# Patient Record
Sex: Female | Born: 1950 | Race: White | Hispanic: No | Marital: Single
Health system: Midwestern US, Academic
[De-identification: ages and names within clinical notes are randomized; demographics above are authoritative.]

---

## 2018-01-26 ENCOUNTER — Encounter: Admit: 2018-01-26 | Discharge: 2018-01-26 | Payer: MEDICARE

## 2018-01-31 ENCOUNTER — Encounter: Admit: 2018-01-31 | Discharge: 2018-01-31 | Payer: MEDICARE

## 2018-02-27 ENCOUNTER — Encounter: Admit: 2018-02-27 | Discharge: 2018-02-27 | Payer: MEDICARE

## 2018-02-28 ENCOUNTER — Encounter: Admit: 2018-02-28 | Discharge: 2018-02-28 | Payer: MEDICARE

## 2018-02-28 DIAGNOSIS — I82409 Acute embolism and thrombosis of unspecified deep veins of unspecified lower extremity: Principal | ICD-10-CM

## 2018-02-28 DIAGNOSIS — R931 Abnormal findings on diagnostic imaging of heart and coronary circulation: ICD-10-CM

## 2018-03-06 ENCOUNTER — Encounter: Admit: 2018-03-06 | Discharge: 2018-03-06 | Payer: MEDICARE

## 2018-03-06 ENCOUNTER — Ambulatory Visit: Admit: 2018-03-06 | Discharge: 2018-03-06 | Payer: MEDICARE

## 2018-03-06 DIAGNOSIS — R931 Abnormal findings on diagnostic imaging of heart and coronary circulation: ICD-10-CM

## 2018-03-06 DIAGNOSIS — R0600 Dyspnea, unspecified: ICD-10-CM

## 2018-03-06 DIAGNOSIS — R1907 Generalized intra-abdominal and pelvic swelling, mass and lump: ICD-10-CM

## 2018-03-06 DIAGNOSIS — I82421 Acute embolism and thrombosis of right iliac vein: ICD-10-CM

## 2018-03-06 DIAGNOSIS — E669 Obesity, unspecified: ICD-10-CM

## 2018-03-06 DIAGNOSIS — I745 Embolism and thrombosis of iliac artery: ICD-10-CM

## 2018-03-06 DIAGNOSIS — I82409 Acute embolism and thrombosis of unspecified deep veins of unspecified lower extremity: Principal | ICD-10-CM

## 2018-03-20 ENCOUNTER — Ambulatory Visit: Admit: 2018-03-20 | Discharge: 2018-03-21 | Payer: MEDICARE

## 2018-03-20 ENCOUNTER — Encounter: Admit: 2018-03-20 | Discharge: 2018-03-20 | Payer: MEDICARE

## 2018-03-20 ENCOUNTER — Ambulatory Visit: Admit: 2018-03-20 | Discharge: 2018-03-20 | Payer: MEDICARE

## 2018-03-20 DIAGNOSIS — R931 Abnormal findings on diagnostic imaging of heart and coronary circulation: Principal | ICD-10-CM

## 2018-03-20 DIAGNOSIS — R0600 Dyspnea, unspecified: ICD-10-CM

## 2018-03-22 ENCOUNTER — Encounter: Admit: 2018-03-22 | Discharge: 2018-03-22 | Payer: MEDICARE

## 2018-03-22 DIAGNOSIS — R0600 Dyspnea, unspecified: Secondary | ICD-10-CM

## 2018-03-22 DIAGNOSIS — R931 Abnormal findings on diagnostic imaging of heart and coronary circulation: Secondary | ICD-10-CM

## 2018-03-23 ENCOUNTER — Encounter: Admit: 2018-03-23 | Discharge: 2018-03-23 | Payer: MEDICARE

## 2019-04-02 ENCOUNTER — Encounter: Admit: 2019-04-02 | Discharge: 2019-04-02 | Payer: MEDICARE

## 2019-04-02 DIAGNOSIS — R931 Abnormal findings on diagnostic imaging of heart and coronary circulation: Secondary | ICD-10-CM

## 2019-04-02 DIAGNOSIS — I82409 Acute embolism and thrombosis of unspecified deep veins of unspecified lower extremity: Secondary | ICD-10-CM

## 2019-04-02 DIAGNOSIS — R001 Bradycardia, unspecified: Secondary | ICD-10-CM

## 2019-04-02 DIAGNOSIS — E669 Obesity, unspecified: Secondary | ICD-10-CM

## 2019-04-02 DIAGNOSIS — I82421 Acute embolism and thrombosis of right iliac vein: Secondary | ICD-10-CM

## 2019-04-02 DIAGNOSIS — I745 Embolism and thrombosis of iliac artery: Secondary | ICD-10-CM

## 2019-04-02 NOTE — Progress Notes
Date of Service: 04/02/2019    Faustine Ferreri is a 69 y.o. female.       HPI     Patient is a very pleasant 69 year old white female, she was initially evaluated in December 2019.  Patient is here today for a follow-up office visit.  Today she is bradycardic, her heart rate is 52 bpm by EKG and 55 bpm by recorded vitals here in the office.  Her blood pressure is normal, it is 122/74 mmHg.    Patient does not report symptoms of dizziness, no lightheadedness, no presyncope or syncope.  She states she has had a good blood pressure and heart rate her entire life.  She does feel fatigued and probably not quite in shape to perform physical activity.    Other comorbidities include:    ? Elevated calcium score?patient was evaluated with a myocardial perfusion imaging study in December 2019, the tomographic pattern did not demonstrate ischemia.  She was also evaluated with an echocardiogram at that time and that was essentially unremarkable  ? 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 History of large abdominal mass, right lower extremity DVT, multiple pulmonary emboli and displaced ureters  ? 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  ? 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 DOE (dyspnea on exertion) 03/06/2018 ? Obesity (BMI 30.0-34.9) 03/06/2018   ? 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 Generalized abdominal mass 03/06/2018   ? Agatston coronary artery calcium score less than 100 03/06/2018   ? Deep venous thrombosis (HCC) 02/28/2018   ? Elevated coronary artery calcium score 02/28/2018     CT Heart Score [facility] [place] CT HEART SCORE INDICATION: Screening. FINDINGS: The calcium score is 17, which places the patient in the 60th percentile. There is no pericardial effusion. There is no aortic aneurysm. There are no pathologically enlarged lymph nodes. The lungs appear well aerated. Cysts within the hepatic dome. No acute fracture. IMPRESSION: Calcium score of 17, which places the patient in the 60th percentile. Likely mild or minimal coronary stenosis. Clinical History Current History HEART SCORE Previous History/Surgery NON-SMOKER CT Dose Statement All CT scans at this facility use dose modulation, and/or weight based dosing when appropriate to reduce radiation dose to as low as reasonably achievable. Calcium Score 0 : No identifiable atherosclerotic plaque. Very low cardiovascular disease risk. Less than 5% chance of coronary artery disease. A Negative Exam. Calcium Score 1-10 : Minimal plaque burden. Significant coronary artery disease very unlikely. Calcium Score 11-100 : Mild plaque burden. Likely mild or minimal coronary stenosis. Calcium Score 101-400 : Moderate plaque burden. Moderate non-obstructive coronary artery disease highly likely. Cardiology consultation  may be indicated based on risk factors. Calcium Score Over 400 : Extensive plaque burden.High likelihood of at lease one significant coronary stenosis (50% diameter). Cardiology consultation recommended. Over 1000 - encouraged.                Review of Systems   Constitution: Negative.   HENT: Negative.    Eyes: Negative.    Cardiovascular: Negative.    Respiratory: Negative.    Endocrine: Negative.    Hematologic/Lymphatic: Negative. Skin: Negative.    Musculoskeletal: Negative.    Gastrointestinal: Negative.    Genitourinary: Negative.    Neurological: Negative.    Psychiatric/Behavioral: Negative.    Allergic/Immunologic: Negative.        Physical Exam  General Appearance: normal in appearance  Skin: warm, moist, no ulcers or xanthomas  Eyes: conjunctivae and lids normal, pupils are equal and round  Lips & Oral Mucosa: no pallor or cyanosis  Neck Veins: neck veins are flat, neck veins are not distended  Chest Inspection: chest is normal in appearance  Respiratory Effort: breathing comfortably, no respiratory distress  Auscultation/Percussion: lungs clear to auscultation, no rales or rhonchi, no wheezing  Cardiac Rhythm: regular rhythm and normal rate  Cardiac Auscultation: S1, S2 normal, no rub, no gallop  Murmurs: no murmur  Carotid Arteries: normal carotid upstroke bilaterally, no bruit  Lower Extremity Edema: 1?2+ bilateral lower extremity edema  Abdominal Exam: soft, non-tender, no masses, bowel sounds normal  Liver & Spleen: no organomegaly  Language and Memory: patient responsive and seems to comprehend information  Neurologic Exam: neurological assessment grossly intact      Cardiovascular Studies  Twelve-lead EKG demonstrates sinus bradycardia, ventricular rate is 52 bpm, incomplete left axis deviation, borderline LVH    Problems Addressed Today  Encounter Diagnoses   Name Primary?   ? Acute deep vein thrombosis (DVT) of iliac vein of right lower extremity (HCC) Yes   ? Elevated coronary artery calcium score    ? Iliac artery occlusion, right (HCC)    ? Obesity (BMI 30.0-34.9)    ? Agatston coronary artery calcium score less than 100    ? Bradycardia        Assessment and Plan In summary: This is a 69 year old white female with asymptomatic sinus bradycardia, previous evaluation with an echocardiogram and perfusion imaging study in December 2019 did not reveal any significant abnormalities, patient does report fatigue and probably overall inability to perform sustained/more intense physical activity.  She has not been expressing any episodes of dizziness, no presyncope or syncope.      Plan:  1.  I did asked the patient to monitor for symptoms of dizziness, lightheadedness and presyncope  2.  Although we initially discussed to proceed with a 14-day Holter monitor and treadmill testing to evaluate for underlying chronotropic incompetence, after further discussing with the patient today it was decided to withhold on this test, continue monitor symptoms for the time being and keep a diary  3.  Also advised on a low-salt diet  4.  Follow-up office visit in 6 months         Current Medications (including today's revisions)  ? MULTIVITAMIN PO Take  by mouth.   ? TURMERIC PO Take  by mouth.

## 2019-06-19 ENCOUNTER — Encounter: Admit: 2019-06-19 | Discharge: 2019-06-19 | Payer: MEDICARE

## 2019-06-19 NOTE — Telephone Encounter
Pt called and states that she knows that she needs to exercise and she is considering purchasing a New-Step exercise machine - she states, "Like they have at [facility]".  She was wondering if Dr. Avie Arenas would be willing to right a prescription for an exercise machine for income tax purposes.  I told her I would discuss with Dr. Avie Arenas and get back with her.  She is agreeable and will wait for our return call.

## 2019-08-22 IMAGING — US ECHOCOMPL
2 series · 14 of 24 positions shown · non-contrast
Comparison: none

[Series 1: us echo 2d, wo/w m-mode, compl · 81 acquisitions, 13 frames shown (1 of 2)]
[im 1/81]
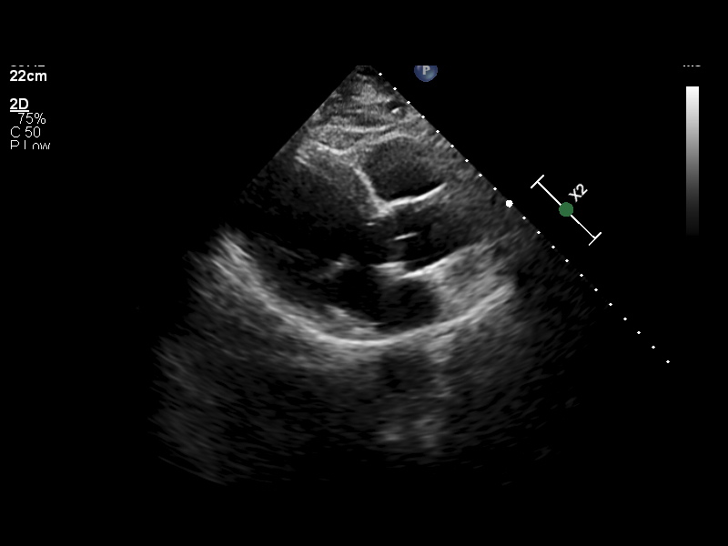
[im 8/81]
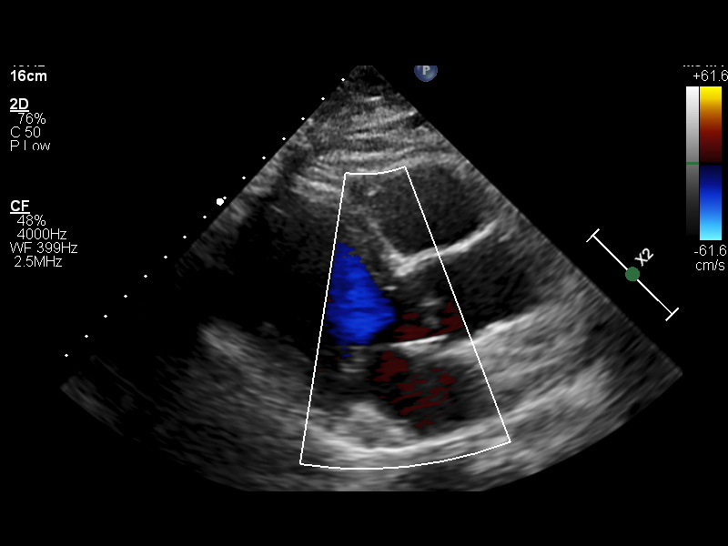
[im 15/81]
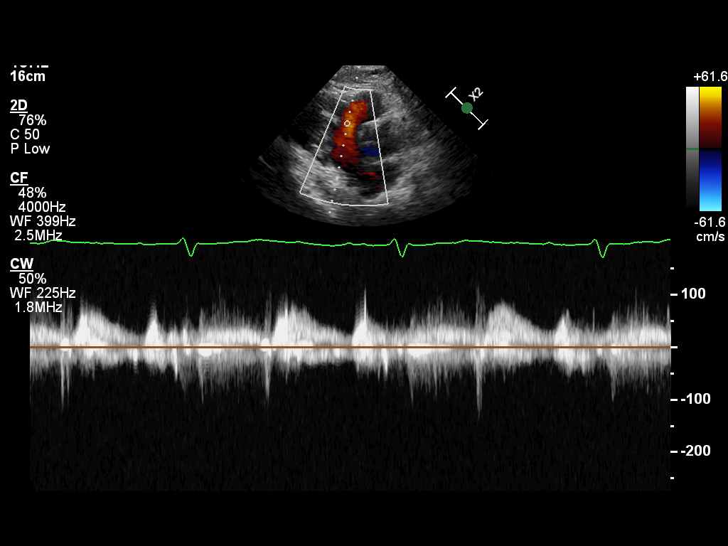
[im 22/81]
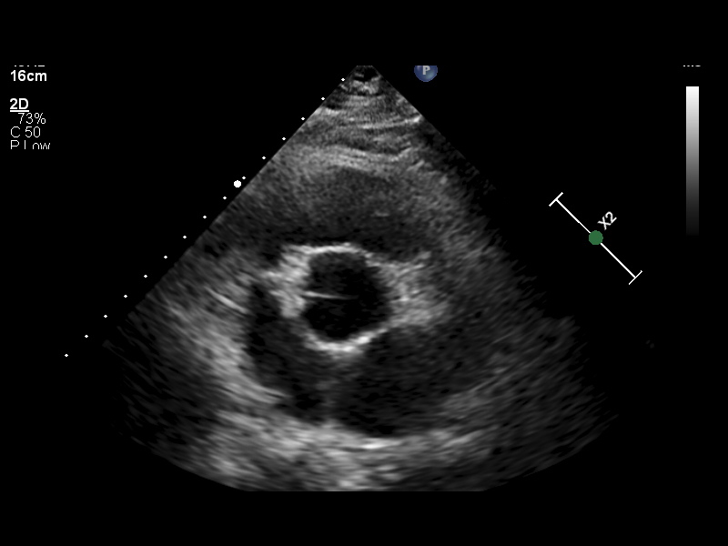
[im 26/81]
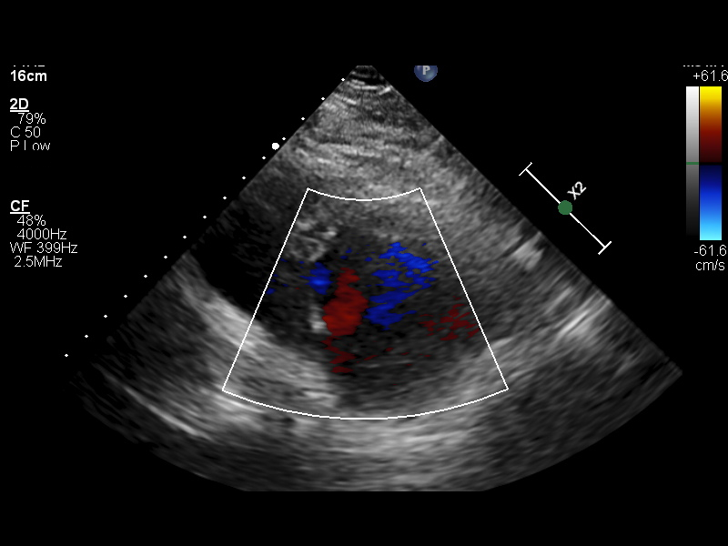
[im 33/81]
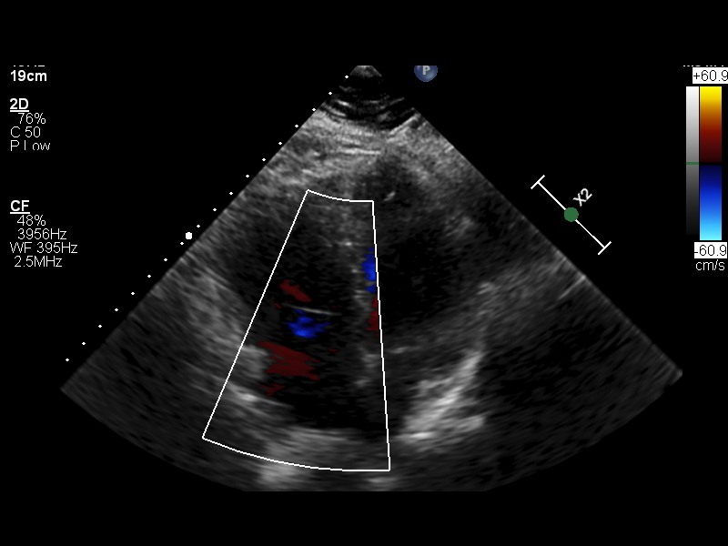
[im 41/81]
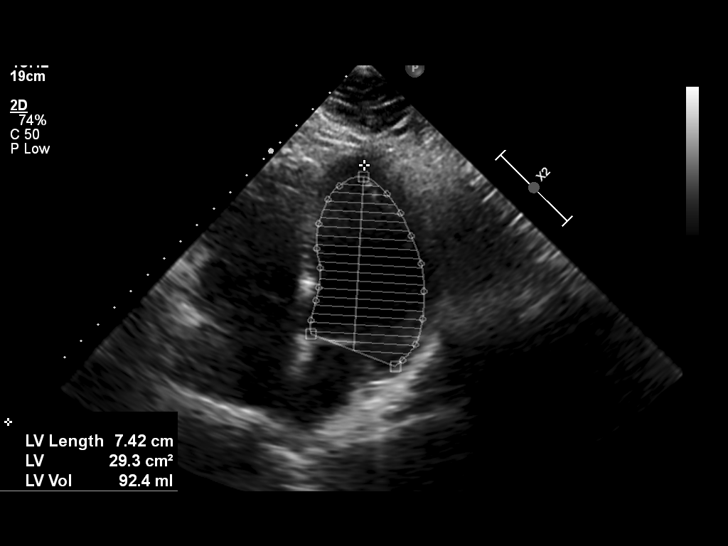
[im 44/81]
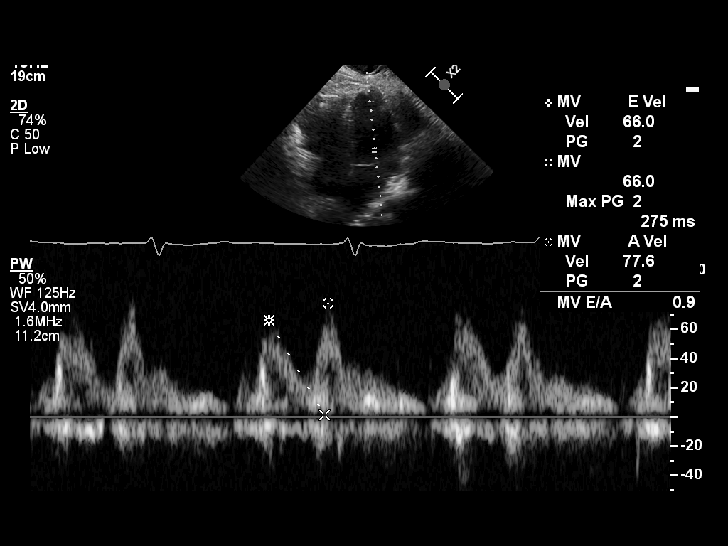
[im 55/81]
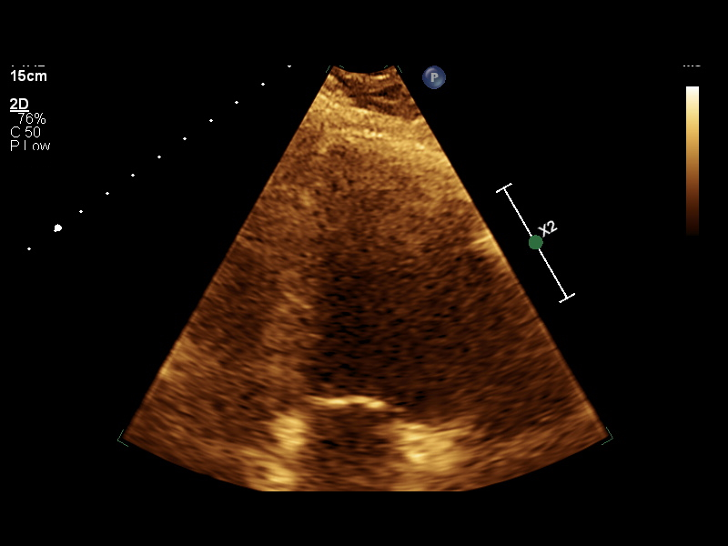
[im 59/81]
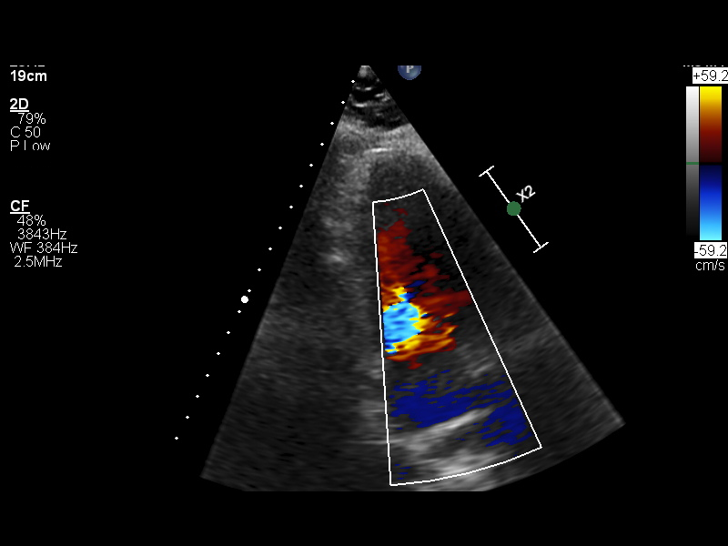
[im 66/81]
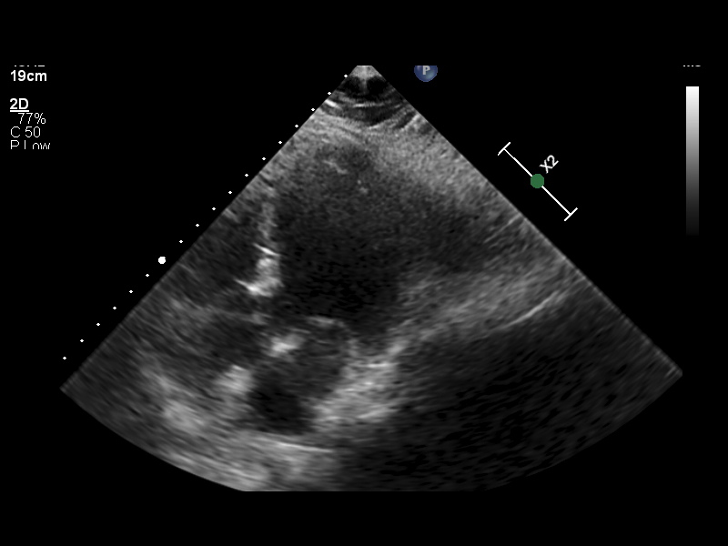
[im 70/81]
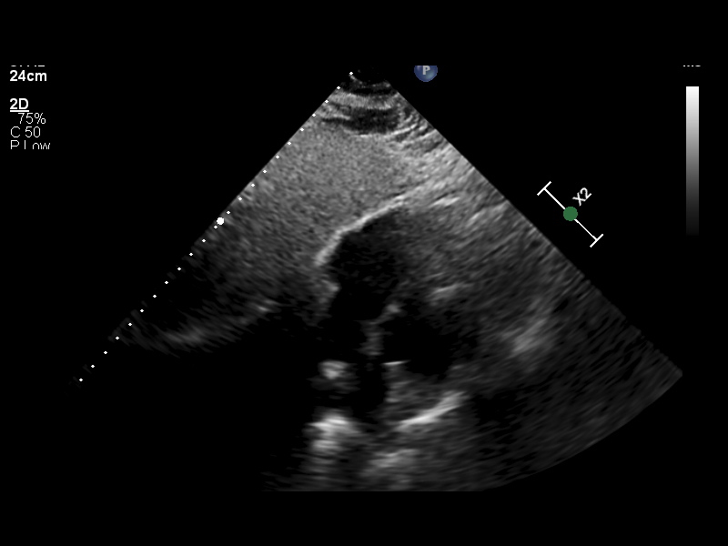
[im 73/81]
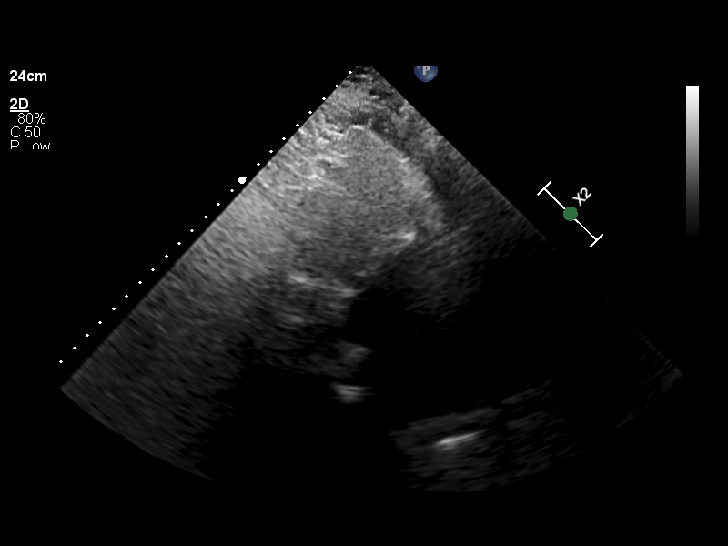

[Series 1: us echo 2d, wo/w m-mode, compl · 1 of 2 slices shown (2 of 2)]
[im 1/2]
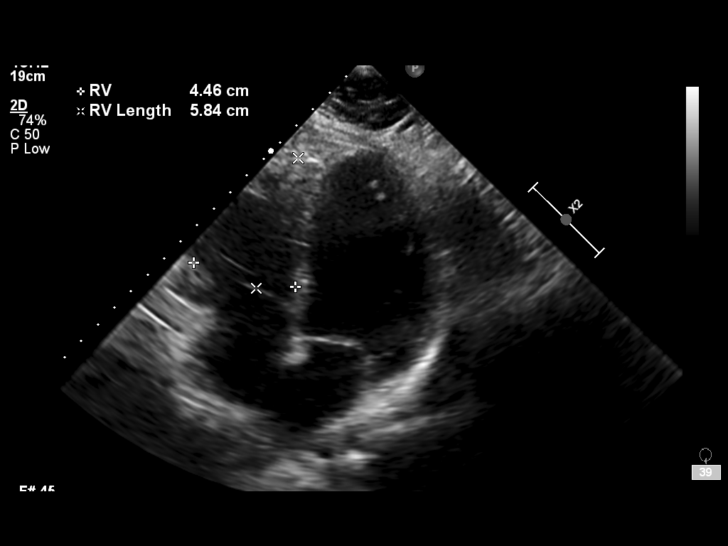

[14 of 24 positions shown; findings below may reference images not displayed]

FINAL REPORT IS SCANNED IN THE PATIENT'S EMR.

Tech Notes:

jl

## 2021-10-25 ENCOUNTER — Encounter: Admit: 2021-10-25 | Discharge: 2021-10-25 | Payer: MEDICARE
